# Patient Record
Sex: Male | Born: 2000 | Race: Black or African American | Hispanic: No | Marital: Single | Smoking: Never smoker
Health system: Southern US, Community
[De-identification: ages and names within clinical notes are randomized; demographics above are authoritative.]

## PROBLEM LIST (undated history)

## (undated) DIAGNOSIS — J45909 Unspecified asthma, uncomplicated: Secondary | ICD-10-CM

## (undated) DIAGNOSIS — Z9109 Other allergy status, other than to drugs and biological substances: Secondary | ICD-10-CM

---
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
## 2019-07-06 IMAGING — DX DG WRIST COMPLETE 3+V*L*
4 series · 4 of 4 positions shown · non-contrast
Comparison: None.

CLINICAL DATA: Wrist pain after lifting injury.

EXAM:
LEFT WRIST - COMPLETE 3+ VIEW

[wrist pa]
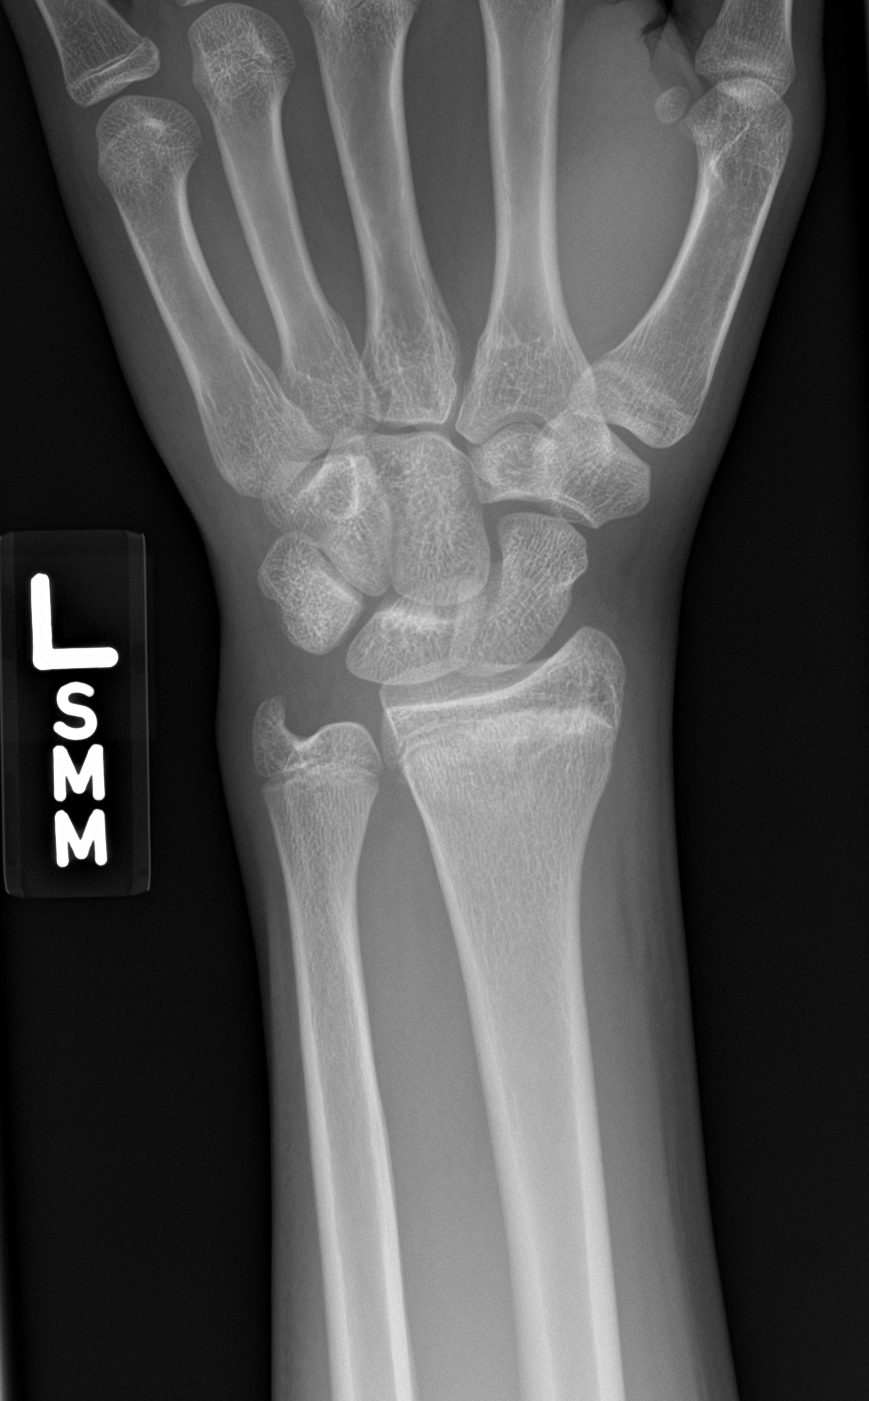

[wrist navicular]
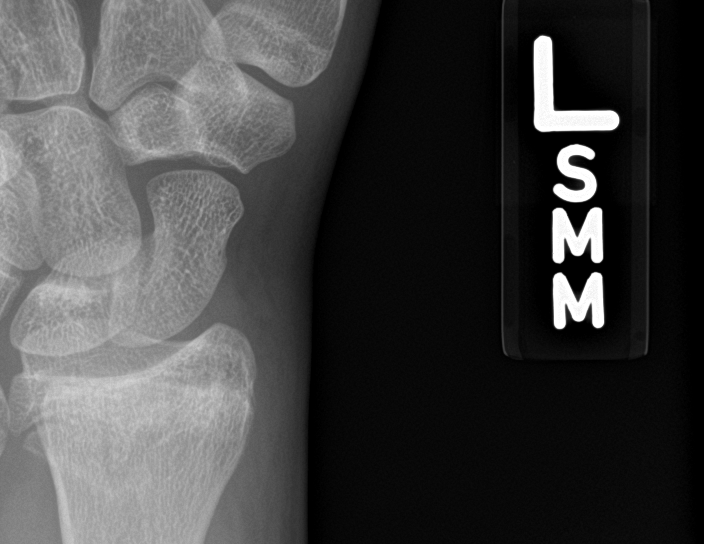

[wrist obl]
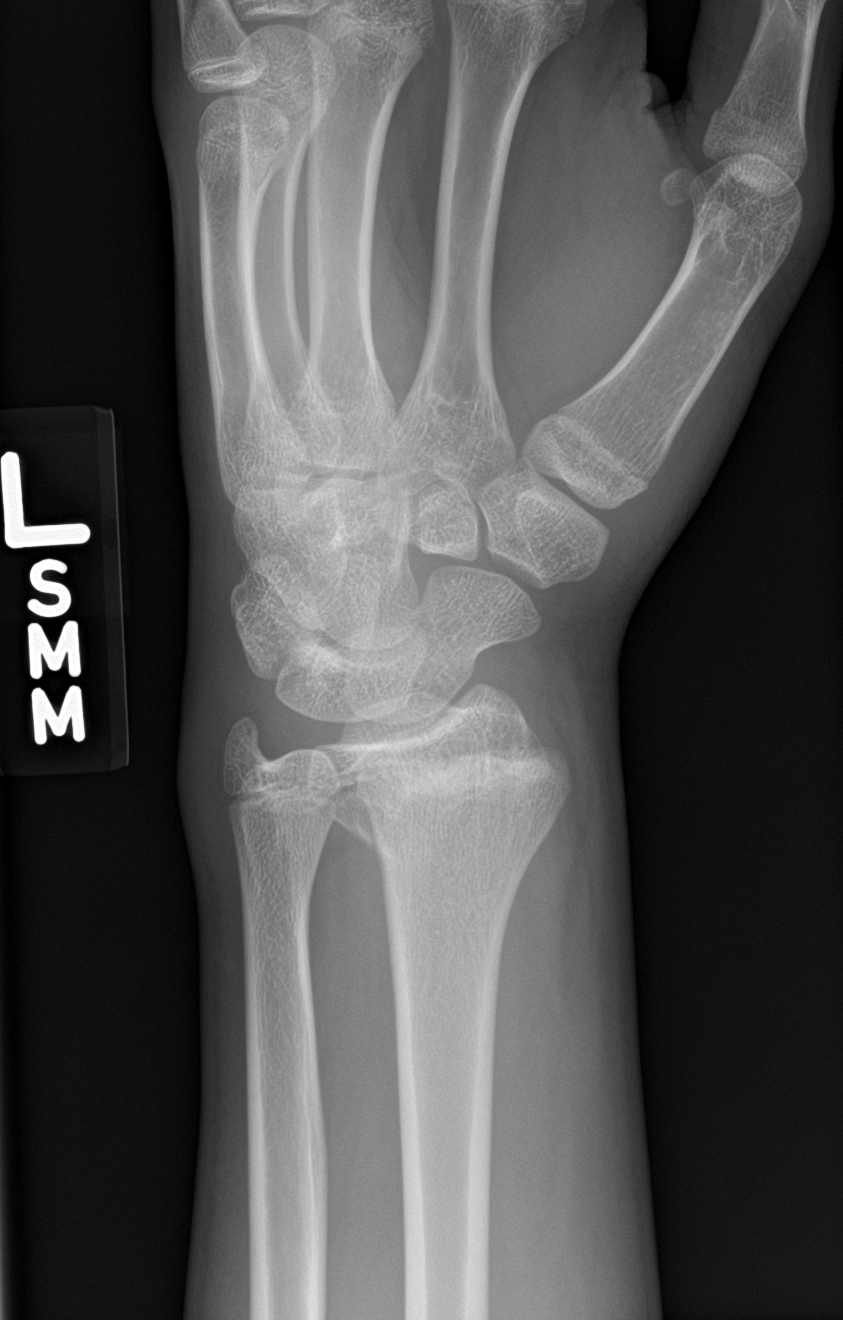

[wrist lat]
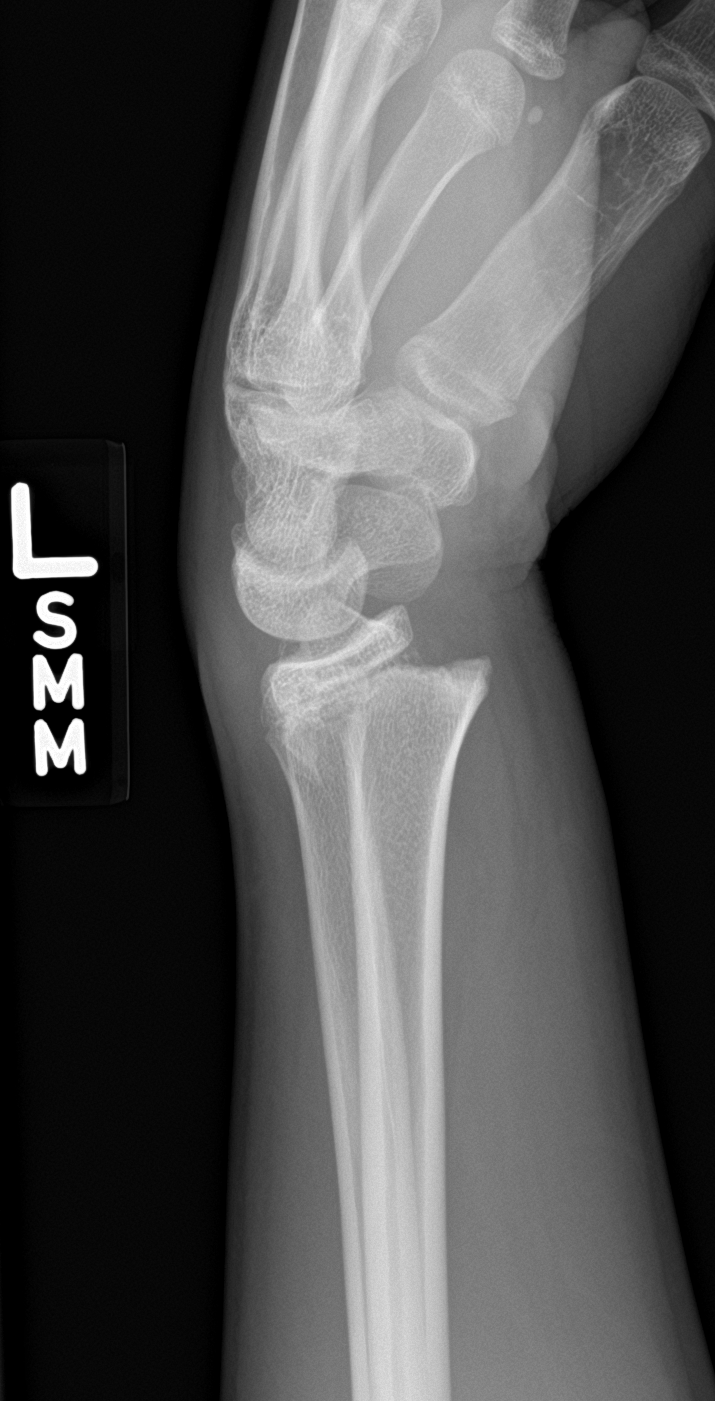

[4 of 4 positions shown; findings below may reference images not displayed]

FINDINGS: Four views study shows Salter-Harris II fracture of the distal tibia
with posterior displacement of the epiphysis and metaphyseal
fracture fragment. No associated distal ulnar fracture is evident.
IMPRESSION: 1. Salter-Harris II fracture distal radius with posterior
displacement of the distal fracture fragment.
2. No associated distal ulna fracture evident.

## 2020-08-02 ENCOUNTER — Other Ambulatory Visit: Payer: Self-pay

## 2020-08-02 ENCOUNTER — Ambulatory Visit (HOSPITAL_COMMUNITY)
Admission: EM | Admit: 2020-08-02 | Discharge: 2020-08-02 | Disposition: A | Discharge status: Home / Self Care | Payer: Medicaid Other | Attending: Family Medicine | Admitting: Family Medicine

## 2020-08-02 ENCOUNTER — Encounter (HOSPITAL_COMMUNITY): Payer: Self-pay | Admitting: Emergency Medicine

## 2020-08-02 DIAGNOSIS — L309 Dermatitis, unspecified: Secondary | ICD-10-CM

## 2020-08-02 MED ORDER — TRIAMCINOLONE ACETONIDE 0.025 % EX OINT: 1.0000 "application " | TOPICAL_OINTMENT | Freq: Two times a day (BID) | CUTANEOUS | 0 refills | Status: AC

## 2020-08-02 MED ORDER — CETIRIZINE HCL 10 MG PO TABS: 10.0000 mg | ORAL_TABLET | Freq: Every day | ORAL | 11 refills | Status: AC

## 2020-08-02 NOTE — ED Provider Notes (Signed)
[facility]    CSN: [id number] Arrival date & time: 08/02/20  1007      History   Chief Complaint Chief Complaint  Patient presents with  . Rash    HPI Brandon Robertson is a 19 y.o. male.   HPI  Patient presents today with a rash that has developed over the last week.    Patient reports a distant history of atopic dermatitis however has not had recurrent flareups since that time.  He reports rash developed shortly after processed food and using a buffalo dip.  He reports that rash is nonpruritic and seem to improve with Benadryl however has recurred.  Rash is distributed to his bilateral arms and chest.  He has some lesions on his back which have almost completely healed.  Denies any fever, recent illness, difficulty swallowing or difficulty breathing. Past Medical History:  Diagnosis Date  . Asthma   . Pollen allergy     There are no problems to display for this patient.   History reviewed. No pertinent surgical history.     Home Medications    Prior to Admission medications   Medication Sig Start Date End Date Taking? Authorizing Provider  EPINEPHrine (EPI-PEN) 0.3 mg/0.3 mL DEVI Inject 0.3 mLs (0.3 mg total) into the muscle once. 01/27/12   Reuben Likes, MD  Loratadine (CLARITIN PO) Take by mouth.    [provider]  oxyCODONE-acetaminophen (PERCOCET/ROXICET) 5-325 MG tablet Take one tablet approximately one hour before your appointment. You may take additional tablet if needed. 08/16/17   Mardella Layman, MD    Family History History reviewed. No pertinent family history.  Social History Social History   Tobacco Use  . Smoking status: Never Smoker  . Smokeless tobacco: Never Used  Substance Use Topics  . Alcohol use: No  . Drug use: No     Allergies   Peanut-containing drug products   Review of Systems Review of Systems Pertinent negatives listed in HPI  Physical Exam Triage Vital Signs ED Triage Vitals  Enc Vitals Group      BP 08/02/20 1022 (!) 143/73     Pulse Rate 08/02/20 1022 62     Resp 08/02/20 1022 17     Temp 08/02/20 1022 97.8 F (36.6 C)     Temp Source 08/02/20 1022 Oral     SpO2 08/02/20 1022 100 %     Weight --      Height --      Head Circumference --      Peak Flow --      Pain Score 08/02/20 1021 0     Pain Loc --      Pain Edu? --      Excl. in GC? --    No data found.  Updated Vital Signs BP (!) 143/73 (BP Location: Right Arm)   Pulse 62   Temp 97.8 F (36.6 C) (Oral)   Resp 17   SpO2 100%   Visual Acuity Right Eye Distance:   Left Eye Distance:   Bilateral Distance:    Right Eye Near:   Left Eye Near:    Bilateral Near:     Physical Exam General appearance: alert, well developed, well nourished, cooperative and in no distress Head: Normocephalic, without obvious abnormality, atraumatic Respiratory: Respirations even and unlabored, normal respiratory rate Heart: rate and rhythm normal. No gallop or murmurs noted on exam  Skin: Maculopapular rash present nonvesicular non blistering. Psych: Appropriate mood and affect. Neurologic: Mental status:  Alert, oriented to person, place, and time, thought content appropriate.   UC Treatments / Results  Labs (all labs ordered are listed, but only abnormal results are displayed) Labs Reviewed - No data to display  EKG   Radiology No results found.  Procedures Procedures (including critical care time)  Medications Ordered in UC Medications - No data to display  Initial Impression / Assessment and Plan / UC Course  I have reviewed the triage vital signs and the nursing notes.  Pertinent labs & imaging results that were available during my care of the patient were reviewed by me and considered in my medical decision making (see chart for details).    Acute dermatitis of unknown etiology.  Patient advised to avoid irritants or foods thought to attribute to rash eruption.  Advised to take cetirizine once daily at  bedtime for 7 days.  Also prescribed triamcinolone to be applied topically twice daily over the next 7 days.  If rash recurs he may reuse medication.  If symptoms worsen or do not improve follow-up with primary care provider as he may require dermatology evaluation.  Final Clinical Impressions(s) / UC Diagnoses   Final diagnoses:  Dermatitis     Discharge Instructions     Apply topical triamcinolone steroidal cream to affected areas twice daily over the next 7 days.  Also start cetirizine at bedtime and hold taking any Benadryl while taking this medication.  If rash worsens or does not resolve with prescribed treatment recommend follow-up with primary care provider.    ED Prescriptions    Medication Sig Dispense Auth. Provider   triamcinolone (KENALOG) 0.025 % ointment Apply 1 application topically 2 (two) times daily. 454 g Bing Neighbors, FNP     PDMP not reviewed this encounter.   Bing Neighbors, FNP 08/02/20 1059

## 2020-08-02 NOTE — ED Triage Notes (Signed)
Pt presents with rash on arms, chest, and face. States notices rash after eating sweets last week. Denies pain or itching.

## 2020-08-02 NOTE — Discharge Instructions (Signed)
Apply topical triamcinolone steroidal cream to affected areas twice daily over the next 7 days.  Also start cetirizine at bedtime and hold taking any Benadryl while taking this medication.  If rash worsens or does not resolve with prescribed treatment recommend follow-up with primary care provider.
